# Patient Record
Sex: Female | Born: 1990 | Hispanic: No | Smoking: Former smoker
Health system: Southern US, Community
[De-identification: ages and names within clinical notes are randomized; demographics above are authoritative.]

## PROBLEM LIST (undated history)

## (undated) DIAGNOSIS — J45909 Unspecified asthma, uncomplicated: Secondary | ICD-10-CM

## (undated) HISTORY — PX: SKIN GRAFT: SHX250

## (undated) HISTORY — PX: CHOLECYSTECTOMY: SHX55

---

## 2013-06-26 ENCOUNTER — Encounter (HOSPITAL_COMMUNITY): Payer: Self-pay | Admitting: Emergency Medicine

## 2013-06-26 ENCOUNTER — Emergency Department (HOSPITAL_COMMUNITY)
Admission: EM | Admit: 2013-06-26 | Discharge: 2013-06-26 | Disposition: A | Discharge status: Home / Self Care | Payer: Self-pay | Attending: Emergency Medicine | Admitting: Emergency Medicine

## 2013-06-26 DIAGNOSIS — K529 Noninfective gastroenteritis and colitis, unspecified: Secondary | ICD-10-CM

## 2013-06-26 DIAGNOSIS — F17200 Nicotine dependence, unspecified, uncomplicated: Secondary | ICD-10-CM | POA: Insufficient documentation

## 2013-06-26 DIAGNOSIS — K5289 Other specified noninfective gastroenteritis and colitis: Secondary | ICD-10-CM | POA: Insufficient documentation

## 2013-06-26 LAB — COMPREHENSIVE METABOLIC PANEL
ALK PHOS: 85 U/L (ref 39–117)
ALT: 16 U/L (ref 0–35)
AST: 20 U/L (ref 0–37)
Albumin: 3.9 g/dL (ref 3.5–5.2)
BILIRUBIN TOTAL: 0.2 mg/dL — AB (ref 0.3–1.2)
BUN: 12 mg/dL (ref 6–23)
CHLORIDE: 102 meq/L (ref 96–112)
CO2: 28 mEq/L (ref 19–32)
Calcium: 9.8 mg/dL (ref 8.4–10.5)
Creatinine, Ser: 0.68 mg/dL (ref 0.50–1.10)
GFR calc Af Amer: >90 mL/min (ref >90)
GFR calc non Af Amer: >90 mL/min (ref >90)
Glucose, Bld: 91 mg/dL (ref 70–99)
POTASSIUM: 4.2 meq/L (ref 3.7–5.3)
Sodium: 141 mEq/L (ref 137–147)
Total Protein: 7.9 g/dL (ref 6.0–8.3)

## 2013-06-26 LAB — CBC WITH DIFFERENTIAL/PLATELET
Basophils Absolute: 0 10*3/uL (ref 0.0–0.1)
Basophils Relative: 0 % (ref 0–1)
Eosinophils Absolute: 0.3 10*3/uL (ref 0.0–0.7)
Eosinophils Relative: 3 % (ref 0–5)
HCT: 38.1 % (ref 36.0–46.0)
HEMOGLOBIN: 12.9 g/dL (ref 12.0–15.0)
Lymphocytes Relative: 25 % (ref 12–46)
Lymphs Abs: 2.8 10*3/uL (ref 0.7–4.0)
MCH: 29.5 pg (ref 26.0–34.0)
MCHC: 33.9 g/dL (ref 30.0–36.0)
MCV: 87 fL (ref 78.0–100.0)
MONOS PCT: 8 % (ref 3–12)
Monocytes Absolute: 0.9 10*3/uL (ref 0.1–1.0)
NEUTROS ABS: 7.2 10*3/uL (ref 1.7–7.7)
NEUTROS PCT: 64 % (ref 43–77)
Platelets: 263 10*3/uL (ref 150–400)
RBC: 4.38 MIL/uL (ref 3.87–5.11)
RDW: 13.8 % (ref 11.5–15.5)
WBC: 11.2 10*3/uL — AB (ref 4.0–10.5)

## 2013-06-26 LAB — LIPASE, BLOOD: Lipase: 29 U/L (ref 11–59)

## 2013-06-26 MED ORDER — SODIUM CHLORIDE 0.9 % IV BOLUS (SEPSIS)
1000.0000 mL | Freq: Once | INTRAVENOUS | Status: AC
  Administered 2013-06-26: 1000 mL via INTRAVENOUS

## 2013-06-26 MED ORDER — IBUPROFEN 800 MG PO TABS: ORAL_TABLET | ORAL | Status: DC

## 2013-06-26 MED ORDER — KETOROLAC TROMETHAMINE 30 MG/ML IJ SOLN
30.0000 mg | Freq: Once | INTRAMUSCULAR | Status: AC
  Administered 2013-06-26: 30 mg via INTRAVENOUS
  Filled 2013-06-26: qty 1

## 2013-06-26 MED ORDER — PROMETHAZINE HCL 25 MG PO TABS: 25.0000 mg | ORAL_TABLET | Freq: Four times a day (QID) | ORAL | Status: DC | PRN

## 2013-06-26 MED ORDER — HYDROCODONE-ACETAMINOPHEN 5-325 MG PO TABS
1.0000 | ORAL_TABLET | Freq: Once | ORAL | Status: AC
  Administered 2013-06-26: 1 via ORAL
  Filled 2013-06-26: qty 1

## 2013-06-26 MED ORDER — ONDANSETRON HCL 4 MG/2ML IJ SOLN
4.0000 mg | Freq: Once | INTRAMUSCULAR | Status: AC
  Administered 2013-06-26: 4 mg via INTRAVENOUS
  Filled 2013-06-26: qty 2

## 2013-06-26 NOTE — ED Notes (Signed)
Pt c/o headache, nausea that started yesterday, vomiting and diarrhea, generalized body aches that started today,

## 2013-06-26 NOTE — Discharge Instructions (Signed)
Follow up if not improving.  Drink plenty of fluids °

## 2013-06-26 NOTE — ED Provider Notes (Signed)
CSN: [id number]     Arrival date & time 06/26/13  1608 History   First MD Initiated Contact with Patient 06/26/13 1719     Chief Complaint  Patient presents with  . Emesis     (Consider location/radiation/quality/duration/timing/severity/associated sxs/prior Treatment) Patient is a 23 y.o. female presenting with vomiting. The history is provided by the patient (the pt complains of vomiting diarhea and muscle aches).  Emesis Severity:  Moderate Quality:  Undigested food Feeding tolerance: unknown. Progression:  Unchanged Chronicity:  New Relieved by:  Nothing Associated symptoms: diarrhea   Associated symptoms: no abdominal pain and no headaches     History reviewed. No pertinent past medical history. Past Surgical History  Procedure Laterality Date  . Cholecystectomy     No family history on file. History  Substance Use Topics  . Smoking status: Current Every Day Smoker  . Smokeless tobacco: Not on file  . Alcohol Use: Yes     Comment: occassional    OB History   Grav Para Term Preterm Abortions TAB SAB Ect Mult Living                 Review of Systems  Constitutional: Negative for appetite change and fatigue.  HENT: Negative for congestion, ear discharge and sinus pressure.   Eyes: Negative for discharge.  Respiratory: Negative for cough.   Cardiovascular: Negative for chest pain.  Gastrointestinal: Positive for vomiting and diarrhea. Negative for abdominal pain.  Genitourinary: Negative for frequency and hematuria.  Musculoskeletal: Negative for back pain.  Skin: Negative for rash.  Neurological: Negative for seizures and headaches.  Psychiatric/Behavioral: Negative for hallucinations.      Allergies  Contrast media and Iodine  Home Medications   Current Outpatient Rx  Name  Route  Sig  Dispense  Refill  . acetaminophen (TYLENOL) 500 MG tablet   Oral   Take 500 mg by mouth every 6 (six) hours as needed.         Marland Kitchen albuterol (PROVENTIL HFA;VENTOLIN  HFA) 108 (90 BASE) MCG/ACT inhaler   Inhalation   Inhale 1 puff into the lungs every 6 (six) hours as needed for wheezing or shortness of breath.         . beclomethasone (QVAR) 80 MCG/ACT inhaler   Inhalation   Inhale 1 puff into the lungs 2 (two) times daily as needed (for shortness of breath).         Marland Kitchen ibuprofen (ADVIL,MOTRIN) 800 MG tablet      Take one every 8 hours for aches or pain.   15 tablet   0   . promethazine (PHENERGAN) 25 MG tablet   Oral   Take 1 tablet (25 mg total) by mouth every 6 (six) hours as needed for nausea or vomiting.   15 tablet   0    BP 115/74  Pulse 73  Temp(Src) 98.2 F (36.8 C) (Oral)  Resp 16  Ht 5\' 4"  (1.626 m)  Wt 190 lb (86.183 kg)  BMI 32.60 kg/m2  SpO2 100%  LMP 06/06/2013 Physical Exam  Constitutional: She is oriented to person, place, and time. She appears well-developed.  HENT:  Head: Normocephalic.  Eyes: Conjunctivae and EOM are normal. No scleral icterus.  Neck: Neck supple. No thyromegaly present.  Cardiovascular: Normal rate and regular rhythm.  Exam reveals no gallop and no friction rub.   No murmur heard. Pulmonary/Chest: No stridor. She has no wheezes. She has no rales. She exhibits no tenderness.  Abdominal: She exhibits no distension.  There is no tenderness. There is no rebound.  Musculoskeletal: Normal range of motion. She exhibits no edema.  Lymphadenopathy:    She has no cervical adenopathy.  Neurological: She is oriented to person, place, and time. She exhibits normal muscle tone. Coordination normal.  Skin: No rash noted. No erythema.  Psychiatric: She has a normal mood and affect. Her behavior is normal.    ED Course  Procedures (including critical care time) Labs Review Labs Reviewed  CBC WITH DIFFERENTIAL - Abnormal; Notable for the following:    WBC 11.2 (*)    All other components within normal limits  COMPREHENSIVE METABOLIC PANEL - Abnormal; Notable for the following:    Total Bilirubin 0.2  (*)    All other components within normal limits  LIPASE, BLOOD   Imaging Review No results found.   EKG Interpretation None      MDM   Final diagnoses:  Gastroenteritis       Benny LennertJoseph L Janiah Devinney, MD 06/26/13 1944

## 2014-07-25 ENCOUNTER — Emergency Department (HOSPITAL_COMMUNITY)
Admission: EM | Admit: 2014-07-25 | Discharge: 2014-07-25 | Disposition: A | Discharge status: Home / Self Care | Payer: 59 | Attending: Emergency Medicine | Admitting: Emergency Medicine

## 2014-07-25 ENCOUNTER — Encounter (HOSPITAL_COMMUNITY): Payer: Self-pay | Admitting: *Deleted

## 2014-07-25 ENCOUNTER — Emergency Department (HOSPITAL_COMMUNITY): Payer: 59

## 2014-07-25 DIAGNOSIS — J45909 Unspecified asthma, uncomplicated: Secondary | ICD-10-CM | POA: Insufficient documentation

## 2014-07-25 DIAGNOSIS — Z9049 Acquired absence of other specified parts of digestive tract: Secondary | ICD-10-CM | POA: Insufficient documentation

## 2014-07-25 DIAGNOSIS — Z720 Tobacco use: Secondary | ICD-10-CM | POA: Diagnosis not present

## 2014-07-25 DIAGNOSIS — Z3202 Encounter for pregnancy test, result negative: Secondary | ICD-10-CM | POA: Diagnosis not present

## 2014-07-25 DIAGNOSIS — R52 Pain, unspecified: Secondary | ICD-10-CM

## 2014-07-25 DIAGNOSIS — R109 Unspecified abdominal pain: Secondary | ICD-10-CM | POA: Diagnosis present

## 2014-07-25 DIAGNOSIS — Z79899 Other long term (current) drug therapy: Secondary | ICD-10-CM | POA: Insufficient documentation

## 2014-07-25 HISTORY — DX: Unspecified asthma, uncomplicated: J45.909

## 2014-07-25 LAB — CBC WITH DIFFERENTIAL/PLATELET
BASOS ABS: 0 10*3/uL (ref 0.0–0.1)
BASOS PCT: 0 % (ref 0–1)
Eosinophils Absolute: 0.3 10*3/uL (ref 0.0–0.7)
Eosinophils Relative: 4 % (ref 0–5)
HCT: 37.5 % (ref 36.0–46.0)
Hemoglobin: 12 g/dL (ref 12.0–15.0)
Lymphocytes Relative: 31 % (ref 12–46)
Lymphs Abs: 2.4 10*3/uL (ref 0.7–4.0)
MCH: 28.3 pg (ref 26.0–34.0)
MCHC: 32 g/dL (ref 30.0–36.0)
MCV: 88.4 fL (ref 78.0–100.0)
MONOS PCT: 7 % (ref 3–12)
Monocytes Absolute: 0.5 10*3/uL (ref 0.1–1.0)
NEUTROS ABS: 4.5 10*3/uL (ref 1.7–7.7)
NEUTROS PCT: 58 % (ref 43–77)
Platelets: 246 10*3/uL (ref 150–400)
RBC: 4.24 MIL/uL (ref 3.87–5.11)
RDW: 14.2 % (ref 11.5–15.5)
WBC: 7.7 10*3/uL (ref 4.0–10.5)

## 2014-07-25 LAB — URINALYSIS, ROUTINE W REFLEX MICROSCOPIC
Bilirubin Urine: NEGATIVE
Glucose, UA: NEGATIVE mg/dL
HGB URINE DIPSTICK: NEGATIVE
Ketones, ur: NEGATIVE mg/dL
LEUKOCYTES UA: NEGATIVE
NITRITE: NEGATIVE
PROTEIN: NEGATIVE mg/dL
Specific Gravity, Urine: 1.02 (ref 1.005–1.030)
UROBILINOGEN UA: 0.2 mg/dL (ref 0.0–1.0)
pH: 5.5 (ref 5.0–8.0)

## 2014-07-25 LAB — COMPREHENSIVE METABOLIC PANEL
ALT: 16 U/L (ref 0–35)
ANION GAP: 4 — AB (ref 5–15)
AST: 19 U/L (ref 0–37)
Albumin: 3.7 g/dL (ref 3.5–5.2)
Alkaline Phosphatase: 75 U/L (ref 39–117)
BUN: 16 mg/dL (ref 6–23)
CO2: 26 mmol/L (ref 19–32)
Calcium: 8.8 mg/dL (ref 8.4–10.5)
Chloride: 110 mmol/L (ref 96–112)
Creatinine, Ser: 0.75 mg/dL (ref 0.50–1.10)
GFR calc Af Amer: >90 mL/min (ref >90)
Glucose, Bld: 91 mg/dL (ref 70–99)
Potassium: 3.8 mmol/L (ref 3.5–5.1)
SODIUM: 140 mmol/L (ref 135–145)
Total Bilirubin: 0.1 mg/dL — ABNORMAL LOW (ref 0.3–1.2)
Total Protein: 6.8 g/dL (ref 6.0–8.3)

## 2014-07-25 LAB — PREGNANCY, URINE: Preg Test, Ur: NEGATIVE

## 2014-07-25 MED ORDER — HYDROMORPHONE HCL 1 MG/ML IJ SOLN
1.0000 mg | Freq: Once | INTRAMUSCULAR | Status: AC
  Administered 2014-07-25: 1 mg via INTRAVENOUS
  Filled 2014-07-25: qty 1

## 2014-07-25 MED ORDER — HYDROCODONE-ACETAMINOPHEN 5-325 MG PO TABS: 1.0000 | ORAL_TABLET | Freq: Four times a day (QID) | ORAL | Status: DC | PRN

## 2014-07-25 MED ORDER — HYDROMORPHONE HCL 1 MG/ML IJ SOLN
0.5000 mg | Freq: Once | INTRAMUSCULAR | Status: AC
  Administered 2014-07-25: 0.5 mg via INTRAVENOUS
  Filled 2014-07-25: qty 1

## 2014-07-25 MED ORDER — ONDANSETRON HCL 4 MG/2ML IJ SOLN
4.0000 mg | Freq: Once | INTRAMUSCULAR | Status: DC
  Filled 2014-07-25: qty 2

## 2014-07-25 MED ORDER — ONDANSETRON HCL 4 MG/2ML IJ SOLN
4.0000 mg | Freq: Once | INTRAMUSCULAR | Status: AC
  Administered 2014-07-25: 4 mg via INTRAVENOUS

## 2014-07-25 MED ORDER — ONDANSETRON HCL 4 MG/2ML IJ SOLN
4.0000 mg | Freq: Once | INTRAMUSCULAR | Status: AC
  Administered 2014-07-25: 4 mg via INTRAVENOUS
  Filled 2014-07-25: qty 2

## 2014-07-25 NOTE — Discharge Instructions (Signed)
Follow up with [facility] in 1 week °

## 2014-07-25 NOTE — ED Provider Notes (Signed)
CSN: [id number]     Arrival date & time 07/25/14  1924 History   First MD Initiated Contact with Patient 07/25/14 1942     Chief Complaint  Patient presents with  . Flank Pain     (Consider location/radiation/quality/duration/timing/severity/associated sxs/prior Treatment) Patient is a 24 y.o. female presenting with flank pain. The history is provided by the patient (pt complains of right flank pain).  Flank Pain This is a new problem. The current episode started 2 days ago. The problem occurs constantly. The problem has not changed since onset.Associated symptoms include abdominal pain. Pertinent negatives include no chest pain and no headaches. Nothing aggravates the symptoms. Nothing relieves the symptoms.    Past Medical History  Diagnosis Date  . Asthma    Past Surgical History  Procedure Laterality Date  . Cholecystectomy     History reviewed. No pertinent family history. History  Substance Use Topics  . Smoking status: Current Every Day Smoker  . Smokeless tobacco: Not on file  . Alcohol Use: Yes     Comment: occassional    OB History    No data available     Review of Systems  Constitutional: Negative for appetite change and fatigue.  HENT: Negative for congestion, ear discharge and sinus pressure.   Eyes: Negative for discharge.  Respiratory: Negative for cough.   Cardiovascular: Negative for chest pain.  Gastrointestinal: Positive for abdominal pain. Negative for diarrhea.  Genitourinary: Positive for flank pain. Negative for frequency and hematuria.  Musculoskeletal: Negative for back pain.  Skin: Negative for rash.  Neurological: Negative for seizures and headaches.  Psychiatric/Behavioral: Negative for hallucinations.      Allergies  Contrast media and Iodine  Home Medications   Prior to Admission medications   Medication Sig Start Date End Date Taking? Authorizing Provider  acetaminophen (TYLENOL) 500 MG tablet Take 500 mg by mouth every 6 (six)  hours as needed for mild pain or moderate pain.    Yes Historical Provider, MD  phenazopyridine (PYRIDIUM) 100 MG tablet Take 100 mg by mouth 3 (three) times daily as needed for pain.   Yes Historical Provider, MD  UNKNOWN TO PATIENT Take 1 capsule by mouth 3 (three) times daily. ANTIBIOTIC FOR UTI PRESCRIBED 07/24/2014   Yes Historical Provider, MD  albuterol (PROVENTIL HFA;VENTOLIN HFA) 108 (90 BASE) MCG/ACT inhaler Inhale 1 puff into the lungs every 6 (six) hours as needed for wheezing or shortness of breath.    Historical Provider, MD  beclomethasone (QVAR) 80 MCG/ACT inhaler Inhale 1 puff into the lungs 2 (two) times daily as needed (for shortness of breath).    Historical Provider, MD   BP 110/52 mmHg  Pulse 77  Temp(Src) 98.2 F (36.8 C) (Oral)  Resp 24  Ht  (1.626 m)  Wt 234 lb (106.142 kg)  BMI 40.15 kg/m2  SpO2 100%  LMP 07/21/2014 Physical Exam  Constitutional: She is oriented to person, place, and time. She appears well-developed.  HENT:  Head: Normocephalic.  Eyes: Conjunctivae and EOM are normal. No scleral icterus.  Neck: Neck supple. No thyromegaly present.  Cardiovascular: Normal rate and regular rhythm.  Exam reveals no gallop and no friction rub.   No murmur heard. Pulmonary/Chest: No stridor. She has no wheezes. She has no rales. She exhibits no tenderness.  Abdominal: She exhibits no distension. There is no tenderness. There is no rebound.  Genitourinary:  Tender right flank  Musculoskeletal: Normal range of motion. She exhibits no edema.  Lymphadenopathy:  She has no cervical adenopathy.  Neurological: She is oriented to person, place, and time. She exhibits normal muscle tone. Coordination normal.  Skin: No rash noted. No erythema.  Psychiatric: She has a normal mood and affect. Her behavior is normal.    ED Course  Procedures (including critical care time) Labs Review Labs Reviewed  COMPREHENSIVE METABOLIC PANEL - Abnormal; Notable for the  following:    Total Bilirubin 0.1 (*)    Anion gap 4 (*)    All other components within normal limits  CBC WITH DIFFERENTIAL/PLATELET  URINALYSIS, ROUTINE W REFLEX MICROSCOPIC  PREGNANCY, URINE    Imaging Review No results found.   EKG Interpretation None      MDM   Final diagnoses:  Pain    Flank pain    Bethann BerkshireJoseph Elery Cadenhead, MD 07/25/14 2147

## 2014-07-25 NOTE — ED Notes (Signed)
Rt flank pain, hematuria, seen at Urgent care and given med for  UTI.  N/v  No fever

## 2014-07-26 NOTE — ED Notes (Signed)
Pt. Reports being seen at urgent care yesterday. Pt. Reports being treated for UTI. Pt. Reports that they ruled out kidney stone yesterday. Pt. Reports pain with urination. Pt. Reports working in woods several days ago and reports that friend that was with her is having similar symptoms. Pt. Denies being bit by ticks. Pt.denies rash. Pt. Reports vomiting this morning.

## 2015-06-21 ENCOUNTER — Emergency Department (HOSPITAL_COMMUNITY)
Admission: EM | Admit: 2015-06-21 | Discharge: 2015-06-21 | Disposition: A | Discharge status: Home / Self Care | Payer: 59 | Attending: Emergency Medicine | Admitting: Emergency Medicine

## 2015-06-21 ENCOUNTER — Encounter (HOSPITAL_COMMUNITY): Payer: Self-pay

## 2015-06-21 DIAGNOSIS — F17200 Nicotine dependence, unspecified, uncomplicated: Secondary | ICD-10-CM | POA: Insufficient documentation

## 2015-06-21 DIAGNOSIS — Z79899 Other long term (current) drug therapy: Secondary | ICD-10-CM | POA: Insufficient documentation

## 2015-06-21 DIAGNOSIS — R112 Nausea with vomiting, unspecified: Secondary | ICD-10-CM | POA: Insufficient documentation

## 2015-06-21 DIAGNOSIS — R509 Fever, unspecified: Secondary | ICD-10-CM

## 2015-06-21 DIAGNOSIS — E860 Dehydration: Secondary | ICD-10-CM | POA: Insufficient documentation

## 2015-06-21 DIAGNOSIS — R197 Diarrhea, unspecified: Secondary | ICD-10-CM | POA: Insufficient documentation

## 2015-06-21 DIAGNOSIS — J45909 Unspecified asthma, uncomplicated: Secondary | ICD-10-CM | POA: Insufficient documentation

## 2015-06-21 DIAGNOSIS — M791 Myalgia, unspecified site: Secondary | ICD-10-CM

## 2015-06-21 LAB — COMPREHENSIVE METABOLIC PANEL
ALT: 16 U/L (ref 14–54)
AST: 17 U/L (ref 15–41)
Albumin: 4.1 g/dL (ref 3.5–5.0)
Alkaline Phosphatase: 82 U/L (ref 38–126)
Anion gap: 6 (ref 5–15)
BUN: 10 mg/dL (ref 6–20)
CALCIUM: 9.1 mg/dL (ref 8.9–10.3)
CO2: 28 mmol/L (ref 22–32)
Chloride: 104 mmol/L (ref 101–111)
Creatinine, Ser: 0.75 mg/dL (ref 0.44–1.00)
GFR calc Af Amer: >60 mL/min (ref >60)
Glucose, Bld: 90 mg/dL (ref 65–99)
Potassium: 4 mmol/L (ref 3.5–5.1)
Sodium: 138 mmol/L (ref 135–145)
Total Bilirubin: 0.4 mg/dL (ref 0.3–1.2)
Total Protein: 7.6 g/dL (ref 6.5–8.1)

## 2015-06-21 LAB — CBC
HEMATOCRIT: 39.7 % (ref 36.0–46.0)
HEMOGLOBIN: 12.8 g/dL (ref 12.0–15.0)
MCH: 28.6 pg (ref 26.0–34.0)
MCHC: 32.2 g/dL (ref 30.0–36.0)
MCV: 88.8 fL (ref 78.0–100.0)
Platelets: 259 10*3/uL (ref 150–400)
RBC: 4.47 MIL/uL (ref 3.87–5.11)
RDW: 14.5 % (ref 11.5–15.5)
WBC: 10 10*3/uL (ref 4.0–10.5)

## 2015-06-21 LAB — LIPASE, BLOOD: Lipase: 19 U/L (ref 11–51)

## 2015-06-21 MED ORDER — ONDANSETRON HCL 4 MG/2ML IJ SOLN
4.0000 mg | Freq: Once | INTRAMUSCULAR | Status: AC
  Administered 2015-06-21: 4 mg via INTRAVENOUS
  Filled 2015-06-21: qty 2

## 2015-06-21 MED ORDER — KETOROLAC TROMETHAMINE 30 MG/ML IJ SOLN
30.0000 mg | Freq: Once | INTRAMUSCULAR | Status: AC
  Administered 2015-06-21: 30 mg via INTRAVENOUS
  Filled 2015-06-21: qty 1

## 2015-06-21 MED ORDER — SODIUM CHLORIDE 0.9 % IV BOLUS (SEPSIS)
1000.0000 mL | Freq: Once | INTRAVENOUS | Status: AC
  Administered 2015-06-21: 1000 mL via INTRAVENOUS

## 2015-06-21 MED ORDER — ONDANSETRON 4 MG PO TBDP: 4.0000 mg | ORAL_TABLET | Freq: Three times a day (TID) | ORAL | Status: AC | PRN

## 2015-06-21 NOTE — ED Notes (Signed)
Pt c/o fever, body aches, vomiting, and diarrhea since Monday.

## 2015-06-21 NOTE — Discharge Instructions (Signed)

## 2015-06-21 NOTE — ED Provider Notes (Signed)
CSN: [id number]     Arrival date & time 06/21/15  1132 History   First MD Initiated Contact with Patient 06/21/15 1512     Chief Complaint  Patient presents with  . Generalized Body Aches  . Emesis  . Diarrhea     (Consider location/radiation/quality/duration/timing/severity/associated sxs/prior Treatment) HPI Comments: The patient is a 25 year old female, she has no other significant medical problems, the patient states that she has had approximately 3 days of fever, body aches, headache, nasal congestion as well as nausea and vomiting times several episodes. She has had loose stools yesterday but no blood or watery stools. She denies abdominal pain or back pain. She has had no medications. She believes that she has the flu and states that multiple people around her of had the flu as well. She did not get a flu shot. Her symptoms are persistent, they are not getting better or worse.  Patient is a 25 y.o. female presenting with vomiting and diarrhea. The history is provided by the patient.  Emesis Associated symptoms: diarrhea   Diarrhea Associated symptoms: vomiting     Past Medical History  Diagnosis Date  . Asthma    Past Surgical History  Procedure Laterality Date  . Cholecystectomy     No family history on file. Social History  Substance Use Topics  . Smoking status: Current Every Day Smoker  . Smokeless tobacco: None  . Alcohol Use: No   OB History    No data available     Review of Systems  Gastrointestinal: Positive for vomiting and diarrhea.  All other systems reviewed and are negative.     Allergies  Contrast media and Iodine  Home Medications   Prior to Admission medications   Medication Sig Start Date End Date Taking? Authorizing Provider  acetaminophen (TYLENOL) 500 MG tablet Take 500 mg by mouth every 6 (six) hours as needed for mild pain or moderate pain.    Yes Historical Provider, MD  citalopram (CELEXA) 20 MG tablet Take 20 mg by mouth daily.    Yes Historical Provider, MD  guaiFENesin (MUCINEX) 600 MG 12 hr tablet Take 600 mg by mouth 2 (two) times daily as needed for cough or to loosen phlegm.   Yes Historical Provider, MD  Pseudoeph-Doxylamine-DM-APAP (DAYQUIL/NYQUIL COLD/FLU RELIEF PO) Take 30 mLs by mouth daily as needed (cold).   Yes Historical Provider, MD   BP 106/71 mmHg  Pulse 60  Temp(Src) 98 F (36.7 C) (Temporal)  Resp 18  Ht  (1.626 m)  Wt 220 lb (99.791 kg)  BMI 37.74 kg/m2  SpO2 100%  LMP 06/07/2015 Physical Exam  Constitutional: She appears well-developed and well-nourished. No distress.  HENT:  Head: Normocephalic and atraumatic.  Mouth/Throat: Oropharynx is clear and moist. No oropharyngeal exudate.  Eyes: Conjunctivae and EOM are normal. Pupils are equal, round, and reactive to light. Right eye exhibits no discharge. Left eye exhibits no discharge. No scleral icterus.  Neck: Normal range of motion. Neck supple. No JVD present. No thyromegaly present.  Cardiovascular: Normal rate, regular rhythm, normal heart sounds and intact distal pulses.  Exam reveals no gallop and no friction rub.   No murmur heard. Pulmonary/Chest: Effort normal and breath sounds normal. No respiratory distress. She has no wheezes. She has no rales.  Abdominal: Soft. Bowel sounds are normal. She exhibits no distension and no mass. There is no tenderness.  Musculoskeletal: Normal range of motion. She exhibits no edema or tenderness.  Lymphadenopathy:    She has  no cervical adenopathy.  Neurological: She is alert. Coordination normal.  Skin: Skin is warm and dry. No rash noted. No erythema.  Psychiatric: She has a normal mood and affect. Her behavior is normal.  Nursing note and vitals reviewed.   ED Course  Procedures (including critical care time) Labs Review Labs Reviewed  LIPASE, BLOOD  COMPREHENSIVE METABOLIC PANEL  CBC  URINALYSIS, ROUTINE W REFLEX MICROSCOPIC (NOT AT [facility])  PREGNANCY, URINE    Imaging Review No  results found. I have personally reviewed and evaluated these images and lab results as part of my medical decision-making.    MDM   Final diagnoses:  None    The patient has no abdominal tenderness, she is having ongoing nausea and body aches, her labs are totally unremarkable, vital signs are also unremarkable, I will give her fluid and nausea medication as well as Toradol for her headache, this is symptomatic control of what appears to be influenza-like illness. She is otherwise stable for discharge. She is agreeable to the plan and has been warned that her symptoms may last for another week. She will be given prescriptions to handle her symptoms at home.  The patient has improved significantly , she refuses to give a urine sample, she wants to be discharged home, she will be given medicine for nausea and encouraged to use ibuprofen for body aches and fevers  Meds given in ED:  Medications  sodium chloride 0.9 % bolus 1,000 mL (1,000 mLs Intravenous New Bag/Given 06/21/15 1610)  ondansetron (ZOFRAN) injection 4 mg (4 mg Intravenous Given 06/21/15 1611)  ketorolac (TORADOL) 30 MG/ML injection 30 mg (30 mg Intravenous Given 06/21/15 1612)    New Prescriptions   No medications on file      Eber HongBrian Zackry Deines, MD 06/21/15 1726

## 2017-02-17 ENCOUNTER — Emergency Department (HOSPITAL_COMMUNITY)
Admission: EM | Admit: 2017-02-17 | Discharge: 2017-02-17 | Disposition: A | Discharge status: Home / Self Care | Payer: Self-pay | Attending: Emergency Medicine | Admitting: Emergency Medicine

## 2017-02-17 ENCOUNTER — Encounter (HOSPITAL_COMMUNITY): Payer: Self-pay | Admitting: Cardiology

## 2017-02-17 ENCOUNTER — Emergency Department (HOSPITAL_COMMUNITY): Payer: Self-pay

## 2017-02-17 DIAGNOSIS — J40 Bronchitis, not specified as acute or chronic: Secondary | ICD-10-CM | POA: Insufficient documentation

## 2017-02-17 DIAGNOSIS — Z79899 Other long term (current) drug therapy: Secondary | ICD-10-CM | POA: Insufficient documentation

## 2017-02-17 DIAGNOSIS — Z87891 Personal history of nicotine dependence: Secondary | ICD-10-CM | POA: Insufficient documentation

## 2017-02-17 LAB — INFLUENZA PANEL BY PCR (TYPE A & B)
Influenza A By PCR: NEGATIVE
Influenza B By PCR: NEGATIVE

## 2017-02-17 MED ORDER — AZITHROMYCIN 250 MG PO TABS: 250.0000 mg | ORAL_TABLET | Freq: Every day | ORAL | 0 refills | Status: AC

## 2017-02-17 MED ORDER — BENZONATATE 100 MG PO CAPS: 100.0000 mg | ORAL_CAPSULE | Freq: Three times a day (TID) | ORAL | 0 refills | Status: AC

## 2017-02-17 MED ORDER — ALBUTEROL SULFATE HFA 108 (90 BASE) MCG/ACT IN AERS
2.0000 | INHALATION_SPRAY | RESPIRATORY_TRACT | Status: DC | PRN
  Administered 2017-02-17: 2 via RESPIRATORY_TRACT
  Filled 2017-02-17: qty 6.7

## 2017-02-17 MED ORDER — PREDNISONE 10 MG PO TABS: 20.0000 mg | ORAL_TABLET | Freq: Two times a day (BID) | ORAL | 0 refills | Status: AC

## 2017-02-17 NOTE — ED Triage Notes (Signed)
Fever ,  Nausea and vomiting and generalized body aches.  Coughing.  Symptoms started Thursday.

## 2017-02-17 NOTE — ED Provider Notes (Signed)
[facility] Provider Note   CSN: [id number] Arrival date & time: 02/17/17  1505     History   Chief Complaint Chief Complaint  Patient presents with  . Influenza    HPI Destiny Leonard is a 26 y.o. female with hx of asthma who presents to the ED with flu like symptoms. Patient reports chills, fever, fatigue, aching all over for the past few days. Patient states her work made her come in to   The history is provided by the patient. No language interpreter was used.  Influenza  Presenting symptoms: cough, fatigue, fever, headache, myalgias and sore throat   Presenting symptoms: no nausea and no vomiting   Severity:  Moderate Onset quality:  Gradual Duration:  4 days Progression:  Worsening Chronicity:  New Relieved by:  Nothing Worsened by:  Nothing Ineffective treatments:  Decongestant, hot fluids, OTC medications and rest Associated symptoms: chills, decreased appetite, decreased physical activity, ear pain and nasal congestion   Associated symptoms: no mental status change, no neck stiffness and no syncope     Past Medical History:  Diagnosis Date  . Asthma     There are no active problems to display for this patient.   Past Surgical History:  Procedure Laterality Date  . CHOLECYSTECTOMY    . SKIN GRAFT      OB History    No data available       Home Medications    Prior to Admission medications   Medication Sig Start Date End Date Taking? Authorizing Provider  acetaminophen (TYLENOL) 500 MG tablet Take 500 mg by mouth every 6 (six) hours as needed for mild pain or moderate pain.     [provider]  azithromycin (ZITHROMAX) 250 MG tablet Take 1 tablet (250 mg total) daily by mouth. Take first 2 tablets together, then 1 every day until finished. 02/17/17   Janne NapoleonNeese, Jaret Coppedge M, NP  benzonatate (TESSALON) 100 MG capsule Take 1 capsule (100 mg total) every 8 (eight) hours by mouth. 02/17/17   Janne NapoleonNeese, Alayshia Marini M, NP  citalopram  (CELEXA) 20 MG tablet Take 20 mg by mouth daily.    [provider]  guaiFENesin (MUCINEX) 600 MG 12 hr tablet Take 600 mg by mouth 2 (two) times daily as needed for cough or to loosen phlegm.    [provider]  ondansetron (ZOFRAN ODT) 4 MG disintegrating tablet Take 1 tablet (4 mg total) by mouth every 8 (eight) hours as needed for nausea. 06/21/15   Eber HongMiller, Brian, MD  predniSONE (DELTASONE) 10 MG tablet Take 2 tablets (20 mg total) 2 (two) times daily with a meal by mouth. 02/17/17   Taegan Standage, Theodora BlowHope M, NP  Pseudoeph-Doxylamine-DM-APAP (DAYQUIL/NYQUIL COLD/FLU RELIEF PO) Take 30 mLs by mouth daily as needed (cold).    [provider]    Family History History reviewed. No pertinent family history.  Social History Social History   Tobacco Use  . Smoking status: Former Smoker  Substance Use Topics  . Alcohol use: No  . Drug use: No     Allergies   Contrast media [iodinated diagnostic agents] and Iodine   Review of Systems Review of Systems  Constitutional: Positive for chills, decreased appetite, fatigue and fever.  HENT: Positive for congestion, ear pain and sore throat.   Eyes: Positive for discharge, redness and itching.  Respiratory: Positive for cough and chest tightness.   Cardiovascular: Negative for chest pain.  Gastrointestinal: Negative for abdominal pain, nausea and vomiting.  Genitourinary: Negative  for dysuria, frequency and urgency.  Musculoskeletal: Positive for myalgias. Negative for neck stiffness.  Skin: Negative for rash.  Neurological: Positive for headaches. Negative for syncope.  Psychiatric/Behavioral: Negative for confusion.     Physical Exam Updated Vital Signs BP (!) 149/78 (BP Location: Right Arm)   Pulse 72   Temp 98.6 F (37 C) (Oral)   Resp 20   Ht 5\' 4"  (1.626 m)   Wt 99.8 kg (220 lb)   LMP 08/17/2016   SpO2 100%   BMI 37.76 kg/m   Physical Exam  Constitutional: She appears well-developed and well-nourished.  No distress.  HENT:  Head: Normocephalic and atraumatic.  Right Ear: Tympanic membrane normal.  Left Ear: Tympanic membrane normal.  Nose: Mucosal edema and rhinorrhea present.  Mouth/Throat: Uvula is midline and mucous membranes are normal. Posterior oropharyngeal erythema present. No posterior oropharyngeal edema.  Eyes: Conjunctivae and EOM are normal. Pupils are equal, round, and reactive to light.  Neck: Normal range of motion. Neck supple.  Cardiovascular: Normal rate and regular rhythm.  Pulmonary/Chest: Effort normal. Wheezes: occasional. She exhibits no tenderness.  Abdominal: Soft. There is no tenderness.  Musculoskeletal: Normal range of motion.  Lymphadenopathy:    She has no cervical adenopathy.  Neurological: She is alert.  Skin: Skin is warm and dry.  Psychiatric: She has a normal mood and affect. Her behavior is normal.  Nursing note and vitals reviewed.    ED Treatments / Results  Labs (all labs ordered are listed, but only abnormal results are displayed) Labs Reviewed  INFLUENZA PANEL BY PCR (TYPE A & B)     Radiology Dg Chest 2 View  Result Date: 02/17/2017 CLINICAL DATA:  26 year old female with cough and fever. EXAM: CHEST  2 VIEW COMPARISON:  None. FINDINGS: The heart size and mediastinal contours are within normal limits. Both lungs are clear. The visualized skeletal structures are unremarkable. IMPRESSION: No active cardiopulmonary disease. Electronically Signed   By: Elgie CollardArash  Radparvar M.D.   On: 02/17/2017 18:28    Procedures Procedures (including critical care time)  Medications Ordered in ED Medications  albuterol (PROVENTIL HFA;VENTOLIN HFA) 108 (90 Base) MCG/ACT inhaler 2 puff (not administered)     Initial Impression / Assessment and Plan / ED Course  I have reviewed the triage vital signs and the nursing notes. Pt CXR negative for acute infiltrate. Patients symptoms are consistent with bronchitis, Discussed use of Albuterol in haler for  wheezing. Pt will be discharged with Rx for Zithromax, Tessalon, prednisone. Patient verbalizes understanding and is agreeable with plan. Pt is hemodynamically stable & in NAD prior to dc.  Final Clinical Impressions(s) / ED Diagnoses   Final diagnoses:  Bronchitis    ED Discharge Orders        Ordered    azithromycin (ZITHROMAX) 250 MG tablet  Daily     02/17/17 1916    benzonatate (TESSALON) 100 MG capsule  Every 8 hours     02/17/17 1916    predniSONE (DELTASONE) 10 MG tablet  2 times daily with meals     02/17/17 1916       Kerrie Buffaloeese, Gerritt Galentine RichlandM, NP 02/17/17 1924    Bethann BerkshireZammit, Joseph, MD 02/18/17 1502

## 2017-02-17 NOTE — Discharge Instructions (Signed)
Use the medication as directed. Follow up with your doctor. Return here for worsening symptoms.

## 2017-07-30 ENCOUNTER — Encounter (HOSPITAL_COMMUNITY): Payer: Self-pay | Admitting: Emergency Medicine

## 2017-07-30 ENCOUNTER — Other Ambulatory Visit: Payer: Self-pay

## 2017-07-30 ENCOUNTER — Emergency Department (HOSPITAL_COMMUNITY)
Admission: EM | Admit: 2017-07-30 | Discharge: 2017-07-30 | Disposition: A | Discharge status: Home / Self Care | Payer: 59 | Attending: Emergency Medicine | Admitting: Emergency Medicine

## 2017-07-30 DIAGNOSIS — J45909 Unspecified asthma, uncomplicated: Secondary | ICD-10-CM | POA: Insufficient documentation

## 2017-07-30 DIAGNOSIS — Z87891 Personal history of nicotine dependence: Secondary | ICD-10-CM | POA: Insufficient documentation

## 2017-07-30 DIAGNOSIS — Z79899 Other long term (current) drug therapy: Secondary | ICD-10-CM | POA: Insufficient documentation

## 2017-07-30 DIAGNOSIS — R040 Epistaxis: Secondary | ICD-10-CM | POA: Insufficient documentation

## 2017-07-30 DIAGNOSIS — Z9049 Acquired absence of other specified parts of digestive tract: Secondary | ICD-10-CM | POA: Insufficient documentation

## 2017-07-30 MED ORDER — SILVER NITRATE-POT NITRATE 75-25 % EX MISC
1.0000 | Freq: Once | CUTANEOUS | Status: AC
Start: 2017-07-30 — End: 2017-07-30
  Administered 2017-07-30: 1 via TOPICAL
  Filled 2017-07-30: qty 1

## 2017-07-30 MED ORDER — OXYMETAZOLINE HCL 0.05 % NA SOLN
1.0000 | Freq: Once | NASAL | Status: AC
  Administered 2017-07-30: 1 via NASAL
  Filled 2017-07-30: qty 15

## 2017-07-30 NOTE — Discharge Instructions (Addendum)
Try to avoid blowing your nose or sneezing for 2-3 days.  Return here if needed

## 2017-07-30 NOTE — ED Triage Notes (Signed)
Pt c/o nosebleed x one hour. Pt's right nare was bleeding, bleeding controlled at this time.

## 2017-08-01 NOTE — ED Provider Notes (Signed)
[facility] EMERGENCY DEPARTMENT Provider Note   CSN: [id number] Arrival date & time: 07/30/17  2029     History   Chief Complaint Chief Complaint  Patient presents with  . Epistaxis    HPI Destiny Leonard is a 27 y.o. adult.  HPI   Destiny Leonard is a 27 y.o. adult who presents to the Emergency Department complaining of intermittent nosebleed.  symptom began at work shortly before ER arrival.  She states her nose has been bleeding intermittently for one hour.  This is a recurrent problem.  She was recently seen by her PCP for this and given a nose spray which she states is not helping.  Bleeding has been from the right nostril only.  She denies bleeding disorders, difficulty swallowing or breathing, headache, weakness, vomiting, or dizziness.  No trauma.     Past Medical History:  Diagnosis Date  . Asthma     There are no active problems to display for this patient.   Past Surgical History:  Procedure Laterality Date  . CHOLECYSTECTOMY    . SKIN GRAFT       OB History   None      Home Medications    Prior to Admission medications   Medication Sig Start Date End Date Taking? Authorizing Provider  acetaminophen (TYLENOL) 500 MG tablet Take 500 mg by mouth every 6 (six) hours as needed for mild pain or moderate pain.     [provider]  azithromycin (ZITHROMAX) 250 MG tablet Take 1 tablet (250 mg total) daily by mouth. Take first 2 tablets together, then 1 every day until finished. 02/17/17   Janne Napoleon, NP  benzonatate (TESSALON) 100 MG capsule Take 1 capsule (100 mg total) every 8 (eight) hours by mouth. 02/17/17   Janne Napoleon, NP  citalopram (CELEXA) 20 MG tablet Take 20 mg by mouth daily.    [provider]  guaiFENesin (MUCINEX) 600 MG 12 hr tablet Take 600 mg by mouth 2 (two) times daily as needed for cough or to loosen phlegm.    [provider]  ondansetron (ZOFRAN ODT) 4 MG disintegrating tablet Take 1 tablet (4 mg  total) by mouth every 8 (eight) hours as needed for nausea. 06/21/15   Eber Hong, MD  predniSONE (DELTASONE) 10 MG tablet Take 2 tablets (20 mg total) 2 (two) times daily with a meal by mouth. 02/17/17   Neese, Theodora Blow, NP  Pseudoeph-Doxylamine-DM-APAP (DAYQUIL/NYQUIL COLD/FLU RELIEF PO) Take 30 mLs by mouth daily as needed (cold).    [provider]    Family History History reviewed. No pertinent family history.  Social History Social History   Tobacco Use  . Smoking status: Former Games developer  . Smokeless tobacco: Never Used  Substance Use Topics  . Alcohol use: No  . Drug use: No     Allergies   Contrast media [iodinated diagnostic agents] and Iodine   Review of Systems Review of Systems  Constitutional: Negative for activity change, appetite change, fatigue and fever.  HENT: Positive for nosebleeds. Negative for congestion, sore throat and trouble swallowing.   Respiratory: Negative for cough and shortness of breath.   Cardiovascular: Negative for chest pain.  Gastrointestinal: Negative for abdominal pain, nausea and vomiting.  Musculoskeletal: Negative for neck pain and neck stiffness.  Neurological: Negative for dizziness, facial asymmetry, weakness, light-headedness, numbness and headaches.  Hematological: Does not bruise/bleed easily.     Physical Exam Updated Vital Signs BP 109/69 (BP Location: Left Arm)  Pulse 92   Temp 98.5 F (36.9 C) (Temporal)   Resp 15   Ht 5\' 4"  (1.626 m)   Wt 116.9 kg (257 lb 12.8 oz)   SpO2 100%   BMI 44.25 kg/m   Physical Exam  Constitutional: She appears well-developed and well-nourished. No distress.  HENT:  Head: Atraumatic.  Right Ear: Tympanic membrane and ear canal normal.  Left Ear: Tympanic membrane and ear canal normal.  Nose: Mucosal edema present. No nasal septal hematoma. Epistaxis is observed.  Mild bleeding of the right nare.  Friable area to the anterior septum.  Oropharynx clear and non-edematous.  No  clots.  Nursing note and vitals reviewed.    ED Treatments / Results  Labs (all labs ordered are listed, but only abnormal results are displayed) Labs Reviewed - No data to display  EKG None  Radiology No results found.  Procedures Procedures (including critical care time)  Medications Ordered in ED Medications  oxymetazoline (AFRIN) 0.05 % nasal spray 1 spray (1 spray Each Nare Given 07/30/17 2131)  silver nitrate applicators applicator 1 Stick (1 Stick Topical Given by Other 07/30/17 2148)     Initial Impression / Assessment and Plan / ED Course  I have reviewed the triage vital signs and the nursing notes.  Pertinent labs & imaging results that were available during my care of the patient were reviewed by me and considered in my medical decision making (see chart for details).     Epistaxis resolved after applying Afrin.  Friable area to anterior septum visualized and cauterized by me using a silver nitrate stick. Airway patent.  No associated sx's to indicate need for labs.    Pt observed for awhile and no further bleeding.  Appears safe for d/c home.  Agrees to avoid blowing or picking her nose for 2-3 days.  Return precautions discussed.    Final Clinical Impressions(s) / ED Diagnoses   Final diagnoses:  Anterior epistaxis    ED Discharge Orders    None       Pauline Ausriplett, Prestyn Mahn, PA-C 08/01/17 2302    Maia PlanLong, Joshua G, MD 08/02/17 0110

## 2017-09-16 ENCOUNTER — Emergency Department (HOSPITAL_COMMUNITY)
Admission: EM | Admit: 2017-09-16 | Discharge: 2017-09-17 | Disposition: A | Discharge status: Home / Self Care | Payer: Self-pay | Attending: Emergency Medicine | Admitting: Emergency Medicine

## 2017-09-16 DIAGNOSIS — R059 Cough, unspecified: Secondary | ICD-10-CM

## 2017-09-16 DIAGNOSIS — Z79899 Other long term (current) drug therapy: Secondary | ICD-10-CM | POA: Insufficient documentation

## 2017-09-16 DIAGNOSIS — Z87891 Personal history of nicotine dependence: Secondary | ICD-10-CM | POA: Insufficient documentation

## 2017-09-16 DIAGNOSIS — J45909 Unspecified asthma, uncomplicated: Secondary | ICD-10-CM | POA: Insufficient documentation

## 2017-09-16 DIAGNOSIS — R05 Cough: Secondary | ICD-10-CM

## 2017-09-16 DIAGNOSIS — R1115 Cyclical vomiting syndrome unrelated to migraine: Secondary | ICD-10-CM

## 2017-09-16 DIAGNOSIS — G43A0 Cyclical vomiting, not intractable: Secondary | ICD-10-CM | POA: Insufficient documentation

## 2017-09-17 ENCOUNTER — Other Ambulatory Visit: Payer: Self-pay

## 2017-09-17 ENCOUNTER — Encounter (HOSPITAL_COMMUNITY): Payer: Self-pay

## 2017-09-17 LAB — I-STAT CHEM 8, ED
BUN: 14 mg/dL (ref 6–20)
Calcium, Ion: 1.21 mmol/L (ref 1.15–1.40)
Chloride: 101 mmol/L (ref 101–111)
Creatinine, Ser: 0.7 mg/dL (ref 0.44–1.00)
Glucose, Bld: 115 mg/dL — ABNORMAL HIGH (ref 65–99)
HCT: 34 % — ABNORMAL LOW (ref 36.0–46.0)
HEMOGLOBIN: 11.6 g/dL — AB (ref 12.0–15.0)
POTASSIUM: 3.8 mmol/L (ref 3.5–5.1)
SODIUM: 139 mmol/L (ref 135–145)
TCO2: 25 mmol/L (ref 22–32)

## 2017-09-17 MED ORDER — SODIUM CHLORIDE 0.9 % IV BOLUS
1000.0000 mL | Freq: Once | INTRAVENOUS | Status: AC
  Administered 2017-09-17: 1000 mL via INTRAVENOUS

## 2017-09-17 MED ORDER — SODIUM CHLORIDE 0.9 % IV BOLUS
1000.0000 mL | Freq: Once | INTRAVENOUS | Status: AC
Start: 2017-09-17 — End: 2017-09-17
  Administered 2017-09-17: 1000 mL via INTRAVENOUS

## 2017-09-17 MED ORDER — ONDANSETRON HCL 4 MG/2ML IJ SOLN
4.0000 mg | Freq: Once | INTRAMUSCULAR | Status: AC
  Administered 2017-09-17: 4 mg via INTRAVENOUS
  Filled 2017-09-17: qty 2

## 2017-09-17 NOTE — ED Triage Notes (Addendum)
Had a nosebleed on Friday and it poured blood.  I have been coughing a lot and my chest hurts.  My throat has been sore.  I have been drinking water all day and have not ate anything.  Vomited phlegm per pt.  I have tried gargling with salt water and it has not helped.  I have been taking cold and flu meds without relief.  Also took my mother's old amoxil.  Goody powders for aches and pains.  I have also been taking mucinex.  Patient states that she has been using her inhaler every 2 hours to help with her breathing.

## 2017-09-17 NOTE — ED Provider Notes (Signed)
[facility] EMERGENCY DEPARTMENT Provider Note   CSN: [id number] Arrival date & time: 09/16/17  2334     History   Chief Complaint Chief complaint- cough and vomiting   Pt seen with NP student, I performed history/physical/documentation HPI Destiny Leonard is a 27 y.o. adult.  The history is provided by the patient.  Cough  This is a new problem. The current episode started more than 2 days ago. The problem occurs hourly. The problem has been gradually worsening. There has been no fever. Treatments tried: OTC meds. The treatment provided no relief.  She presents for multiple complaints.  Patient reports multiple episodes of epistaxis recently, none today. She also reports over the past several days she has had increasing cough.  She also reports recent nausea and vomiting.  She reports he has diffuse body pain when she coughs.  No focal abdominal pain at this time.  Chest pain only with cough. She is also tried using albuterol without improvement Past Medical History:  Diagnosis Date  . Asthma     There are no active problems to display for this patient.   Past Surgical History:  Procedure Laterality Date  . CHOLECYSTECTOMY    . SKIN GRAFT       OB History   None      Home Medications    Prior to Admission medications   Medication Sig Start Date End Date Taking? Authorizing Provider  acetaminophen (TYLENOL) 500 MG tablet Take 500 mg by mouth every 6 (six) hours as needed for mild pain or moderate pain.     [provider]  azithromycin (ZITHROMAX) 250 MG tablet Take 1 tablet (250 mg total) daily by mouth. Take first 2 tablets together, then 1 every day until finished. 02/17/17   Janne NapoleonNeese, Hope M, NP  benzonatate (TESSALON) 100 MG capsule Take 1 capsule (100 mg total) every 8 (eight) hours by mouth. 02/17/17   Janne NapoleonNeese, Hope M, NP  citalopram (CELEXA) 20 MG tablet Take 20 mg by mouth daily.    [provider]  guaiFENesin (MUCINEX) 600 MG 12 hr tablet  Take 600 mg by mouth 2 (two) times daily as needed for cough or to loosen phlegm.    [provider]  ondansetron (ZOFRAN ODT) 4 MG disintegrating tablet Take 1 tablet (4 mg total) by mouth every 8 (eight) hours as needed for nausea. 06/21/15   Eber HongMiller, Brian, MD  predniSONE (DELTASONE) 10 MG tablet Take 2 tablets (20 mg total) 2 (two) times daily with a meal by mouth. 02/17/17   Neese, Theodora BlowHope M, NP  Pseudoeph-Doxylamine-DM-APAP (DAYQUIL/NYQUIL COLD/FLU RELIEF PO) Take 30 mLs by mouth daily as needed (cold).    [provider]    Family History History reviewed. No pertinent family history.  Social History Social History   Tobacco Use  . Smoking status: Former Games developermoker  . Smokeless tobacco: Never Used  Substance Use Topics  . Alcohol use: No  . Drug use: No     Allergies   Contrast media [iodinated diagnostic agents] and Iodine   Review of Systems Review of Systems  Constitutional: Positive for fatigue.  Respiratory: Positive for cough.   Gastrointestinal: Positive for vomiting.  All other systems reviewed and are negative.    Physical Exam Updated Vital Signs BP 101/61 (BP Location: Left Arm)   Pulse 72   Temp 98.2 F (36.8 C) (Oral)   Resp 16   Ht 1.626 m (5\' 4" )   Wt 116.2 kg (256 lb 4 oz)  SpO2 97%   BMI 43.99 kg/m   Physical Exam  CONSTITUTIONAL: Well developed/well nourished HEAD: Normocephalic/atraumatic EYES: EOMI ENMT: Mucous membranes moist, no evidence of epistaxis NECK: supple no meningeal signs SPINE/BACK:entire spine nontender CV: S1/S2 noted, no murmurs/rubs/gallops noted LUNGS: Lungs are clear to auscultation bilaterally, no apparent distress ABDOMEN: soft, nontender, no rebound or guarding, bowel sounds noted throughout abdomen NEURO: Pt is awake/alert/appropriate, moves all extremitiesx4.  No facial droop.   EXTREMITIES: pulses normal/equal, full ROM SKIN: warm, color normal PSYCH: no abnormalities of mood noted, alert and  oriented to situation  ED Treatments / Results  Labs (all labs ordered are listed, but only abnormal results are displayed) Labs Reviewed  I-STAT CHEM 8, ED - Abnormal; Notable for the following components:      Result Value   Glucose, Bld 115 (*)    Hemoglobin 11.6 (*)    HCT 34.0 (*)    All other components within normal limits    EKG None  Radiology No results found.  Procedures Procedures (including critical care time)  Medications Ordered in ED Medications  sodium chloride 0.9 % bolus 1,000 mL (0 mLs Intravenous Stopped 09/17/17 0225)  ondansetron (ZOFRAN) injection 4 mg (4 mg Intravenous Given 09/17/17 0150)  sodium chloride 0.9 % bolus 1,000 mL (0 mLs Intravenous Stopped 09/17/17 0322)     Initial Impression / Assessment and Plan / ED Course  I have reviewed the triage vital signs and the nursing notes.  Pertinent labs  results that were available during my care of the patient were reviewed by me and considered in my medical decision making (see chart for details).     Patient in emergency department for cough and vomiting.  She reports feeling dehydrated.  She was given IV fluids with some improvement.  Lung sounds clear, I doubt she has pneumonia.  Vital signs are appropriate this time.  She is requesting discharge home.  Final Clinical Impressions(s) / ED Diagnoses   Final diagnoses:  Cough  Non-intractable cyclical vomiting with nausea    ED Discharge Orders    None       Zadie Rhine, MD 09/17/17 [phone]

## 2017-09-17 NOTE — ED Notes (Signed)
Patient states that she does not feel so well since she has been sick so long.

## 2017-10-16 ENCOUNTER — Encounter (HOSPITAL_COMMUNITY): Payer: Self-pay | Admitting: Emergency Medicine

## 2017-10-16 ENCOUNTER — Emergency Department (HOSPITAL_COMMUNITY): Payer: BLUE CROSS/BLUE SHIELD

## 2017-10-16 ENCOUNTER — Other Ambulatory Visit: Payer: Self-pay

## 2017-10-16 ENCOUNTER — Emergency Department (HOSPITAL_COMMUNITY)
Admission: EM | Admit: 2017-10-16 | Discharge: 2017-10-17 | Disposition: A | Discharge status: Home / Self Care | Payer: BLUE CROSS/BLUE SHIELD | Attending: Emergency Medicine | Admitting: Emergency Medicine

## 2017-10-16 DIAGNOSIS — B9689 Other specified bacterial agents as the cause of diseases classified elsewhere: Secondary | ICD-10-CM | POA: Insufficient documentation

## 2017-10-16 DIAGNOSIS — R1033 Periumbilical pain: Secondary | ICD-10-CM | POA: Diagnosis not present

## 2017-10-16 DIAGNOSIS — R110 Nausea: Secondary | ICD-10-CM | POA: Insufficient documentation

## 2017-10-16 DIAGNOSIS — R809 Proteinuria, unspecified: Secondary | ICD-10-CM | POA: Diagnosis not present

## 2017-10-16 DIAGNOSIS — D509 Iron deficiency anemia, unspecified: Secondary | ICD-10-CM | POA: Diagnosis not present

## 2017-10-16 DIAGNOSIS — N760 Acute vaginitis: Secondary | ICD-10-CM | POA: Insufficient documentation

## 2017-10-16 DIAGNOSIS — Z87891 Personal history of nicotine dependence: Secondary | ICD-10-CM | POA: Diagnosis not present

## 2017-10-16 DIAGNOSIS — R102 Pelvic and perineal pain: Secondary | ICD-10-CM

## 2017-10-16 LAB — CBC WITH DIFFERENTIAL/PLATELET
Basophils Absolute: 0 10*3/uL (ref 0.0–0.1)
Basophils Relative: 0 %
EOS PCT: 1 %
Eosinophils Absolute: 0.1 10*3/uL (ref 0.0–0.7)
HEMATOCRIT: 34.5 % — AB (ref 36.0–46.0)
HEMOGLOBIN: 11 g/dL — AB (ref 12.0–15.0)
LYMPHS ABS: 1.9 10*3/uL (ref 0.7–4.0)
LYMPHS PCT: 16 %
MCH: 26.2 pg (ref 26.0–34.0)
MCHC: 31.9 g/dL (ref 30.0–36.0)
MCV: 82.1 fL (ref 78.0–100.0)
Monocytes Absolute: 0.4 10*3/uL (ref 0.1–1.0)
Monocytes Relative: 4 %
NEUTROS ABS: 9.3 10*3/uL — AB (ref 1.7–7.7)
NEUTROS PCT: 79 %
Platelets: 354 10*3/uL (ref 150–400)
RBC: 4.2 MIL/uL (ref 3.87–5.11)
RDW: 15.7 % — ABNORMAL HIGH (ref 11.5–15.5)
WBC: 11.8 10*3/uL — ABNORMAL HIGH (ref 4.0–10.5)

## 2017-10-16 LAB — COMPREHENSIVE METABOLIC PANEL
ALT: 27 U/L (ref 0–44)
AST: 22 U/L (ref 15–41)
Albumin: 4 g/dL (ref 3.5–5.0)
Alkaline Phosphatase: 81 U/L (ref 38–126)
Anion gap: 9 (ref 5–15)
BILIRUBIN TOTAL: 0.2 mg/dL — AB (ref 0.3–1.2)
BUN: 14 mg/dL (ref 6–20)
CO2: 25 mmol/L (ref 22–32)
CREATININE: 0.81 mg/dL (ref 0.44–1.00)
Calcium: 9.3 mg/dL (ref 8.9–10.3)
Chloride: 107 mmol/L (ref 98–111)
Glucose, Bld: 102 mg/dL — ABNORMAL HIGH (ref 70–99)
Potassium: 3.8 mmol/L (ref 3.5–5.1)
Sodium: 141 mmol/L (ref 135–145)
Total Protein: 7.3 g/dL (ref 6.5–8.1)

## 2017-10-16 LAB — URINALYSIS, ROUTINE W REFLEX MICROSCOPIC
Bacteria, UA: NONE SEEN
Bilirubin Urine: NEGATIVE
GLUCOSE, UA: NEGATIVE mg/dL
Hgb urine dipstick: NEGATIVE
KETONES UR: NEGATIVE mg/dL
Leukocytes, UA: NEGATIVE
Nitrite: NEGATIVE
PH: 6 (ref 5.0–8.0)
Protein, ur: 30 mg/dL — AB
Specific Gravity, Urine: 1.028 (ref 1.005–1.030)

## 2017-10-16 LAB — I-STAT BETA HCG BLOOD, ED (MC, WL, AP ONLY): I-stat hCG, quantitative: <5 m[IU]/mL (ref <5)

## 2017-10-16 LAB — WET PREP, GENITAL
Sperm: NONE SEEN
TRICH WET PREP: NONE SEEN
YEAST WET PREP: NONE SEEN

## 2017-10-16 MED ORDER — ONDANSETRON HCL 4 MG PO TABS: 4.0000 mg | ORAL_TABLET | Freq: Four times a day (QID) | ORAL | 0 refills | Status: AC

## 2017-10-16 MED ORDER — IBUPROFEN 600 MG PO TABS: 600.0000 mg | ORAL_TABLET | Freq: Four times a day (QID) | ORAL | 0 refills | Status: AC | PRN

## 2017-10-16 MED ORDER — OXYCODONE-ACETAMINOPHEN 5-325 MG PO TABS
2.0000 | ORAL_TABLET | Freq: Once | ORAL | Status: AC
  Administered 2017-10-16: 2 via ORAL
  Filled 2017-10-16: qty 2

## 2017-10-16 MED ORDER — MORPHINE SULFATE (PF) 4 MG/ML IV SOLN
4.0000 mg | Freq: Once | INTRAVENOUS | Status: AC
  Administered 2017-10-16: 4 mg via INTRAVENOUS
  Filled 2017-10-16: qty 1

## 2017-10-16 MED ORDER — ONDANSETRON HCL 4 MG/2ML IJ SOLN
4.0000 mg | Freq: Once | INTRAMUSCULAR | Status: AC
  Administered 2017-10-16: 4 mg via INTRAVENOUS
  Filled 2017-10-16: qty 2

## 2017-10-16 MED ORDER — HYDROCODONE-ACETAMINOPHEN 5-325 MG PO TABS: 1.0000 | ORAL_TABLET | Freq: Four times a day (QID) | ORAL | 0 refills | Status: AC | PRN

## 2017-10-16 MED ORDER — LIDOCAINE HCL (PF) 1 % IJ SOLN
INTRAMUSCULAR | Status: AC
  Filled 2017-10-16: qty 30

## 2017-10-16 MED ORDER — BELLADONNA ALKALOIDS-OPIUM 16.2-60 MG RE SUPP
1.0000 | Freq: Once | RECTAL | Status: AC
  Administered 2017-10-16: 1 via RECTAL
  Filled 2017-10-16: qty 1

## 2017-10-16 MED ORDER — METRONIDAZOLE 500 MG PO TABS: 500.0000 mg | ORAL_TABLET | Freq: Two times a day (BID) | ORAL | 0 refills | Status: AC

## 2017-10-16 MED ORDER — DIAZEPAM 5 MG PO TABS
5.0000 mg | ORAL_TABLET | Freq: Once | ORAL | Status: AC
  Administered 2017-10-16: 5 mg via ORAL
  Filled 2017-10-16: qty 1

## 2017-10-16 MED ORDER — KETOROLAC TROMETHAMINE 15 MG/ML IJ SOLN
15.0000 mg | Freq: Once | INTRAMUSCULAR | Status: AC
  Administered 2017-10-16: 15 mg via INTRAVENOUS
  Filled 2017-10-16: qty 1

## 2017-10-16 MED ORDER — SODIUM CHLORIDE 0.9 % IV BOLUS
1000.0000 mL | Freq: Once | INTRAVENOUS | Status: AC
  Administered 2017-10-16: 1000 mL via INTRAVENOUS

## 2017-10-16 MED ORDER — NAPROXEN 375 MG PO TABS: 375.0000 mg | ORAL_TABLET | Freq: Two times a day (BID) | ORAL | 0 refills | Status: AC

## 2017-10-16 NOTE — Discharge Instructions (Addendum)
Please see the information and instructions below regarding your visit.  Your diagnoses today include:  1. Suprapubic pain   2. Proteinuria, unspecified type   3. Microcytic anemia   4. Bacterial vaginosis     Tests performed today include: See side panel of your discharge paperwork for testing performed today. Vital signs are listed at the bottom of these instructions.   Your CT scan is demonstrating a approximately 3 mm cystic lesion on the right ovary.  They recommend a ultrasound repeated in 6 to 12 weeks.  This can be performed by OB/GYN.  This is likely due to resuming menses.  Medications prescribed:    Take any prescribed medications only as prescribed, and any over the counter medications only as directed on the packaging.  Flagyl is an antibiotic used to treat bacterial vaginosis. This medication CANNOT be taken with alcohol, because it can cause nausea and vomiting combined with alcohol. Please also refrain from drinking alcohol for 48 hours after you finish the medicine.  You are prescribed ibuprofen, a non-steroidal anti-inflammatory agent (NSAID) for pain. You may take 600 mg every 6  hours as needed for pain. If still requiring this medication around the clock for acute pain after 10 days, please see your primary healthcare provider.  Women who are pregnant, breastfeeding, or planning on becoming pregnant should not take non-steroidal anti-inflammatories such as Advil and Aleve. Tylenol is a safe over the counter pain reliever in pregnant women..  This is not a long-term medication unless under the care and direction of your primary provider. Taking this medication long-term and not under the supervision of a healthcare provider could increase the risk of stomach ulcers, kidney problems, and cardiovascular problems such as high blood pressure.   You have been prescribed Norco for pain. This is an opioid pain medication. You may take this medication every 4-6 hours as needed  for pain. Only take this medication if you need it for breakthrough pain. You may combine this medicine with ibuprofen, a non-steroidal anti-inflammatory drug (NSAID) every 6 hours, so you are getting something for pain relief every 3 hours.  Do not combine this medication with Tylenol, as it may increase the risk of liver problems.  Do not combine this medication with alcohol.  Please be advised to avoid driving or operating heavy machinery while taking this medication, as it may make you drowsy or impair judgment.    Home care instructions:  Please follow any educational materials contained in this packet.   Please stay hydrated at all times.  This will keep your bladder flow and promote the urge to urinate.  Follow-up instructions: Please follow-up with your primary care provider as soon as possible for further evaluation of your symptoms if they are not completely improved.   Please follow up with OBGYN as soon as possible. I will write them a note to let them know you need follow up.  Return instructions:  Please return to the Emergency Department if you experience worsening symptoms.  Please return the emergency department for any worsening abdominal pain, abdominal pain focused in the right lower side or left lower side, fevers with abdominal pain, or persistent inability to urinate. Please return if you have any other emergent concerns.  Additional Information:   Your vital signs today were: BP 115/81 (BP Location: Right Arm)    Pulse 92    Temp 97.6 F (36.4 C) (Oral)    Resp 20    LMP 08/16/2017    SpO2  99%  If your blood pressure (BP) was elevated on multiple readings during this visit above 130 for the top number or above 80 for the bottom number, please have this repeated by your primary care provider within one month. --------------  Thank you for allowing us to participate in your care today.

## 2017-10-16 NOTE — ED Triage Notes (Signed)
Pt from home with urinary retention x 1 week with "a few" instances of urination. Pt went to UC for a catheter. Pt states they were unable to place foley at that time. Pt is tearful at time of assessment

## 2017-10-16 NOTE — ED Provider Notes (Signed)
[facility]-EMERGENCY DEPT Provider Note   CSN: [id number] Arrival date & time: 10/16/17  1931     History   Chief Complaint No chief complaint on file.   HPI Destiny Greenlexandra Hauge is a 27 y.o. adult.  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  Patient reports that he began having abdominal pain suddenly this afternoon, and realized that he had not urinated in 2 to 3 days.  Patient reports that sensation of attempting to urinate will cause severe and sharp suprapubic pain.  Patient denies any hematuria, vaginal discharge, vaginal bleeding.  Menses are regular, as patient recently came off of testosterone 2 months ago due to being unable to afford it.  Patient denies any fevers, chills.  Patient reports nausea without vomiting.  Last bowel movement yesterday and normal without melena or hematochezia.  Abdominal surgical history significant for cholecystectomy.  Patient denies performing or receiving any penetrative sexual activity, and denies any sexual activity, reporting asexuality.   Past Medical History:  Diagnosis Date  . Asthma     There are no active problems to display for this patient.   Past Surgical History:  Procedure Laterality Date  . CHOLECYSTECTOMY    . SKIN GRAFT       OB History   None      Home Medications    Prior to Admission medications   Medication Sig Start Date End Date Taking? Authorizing Provider  Aspirin-Salicylamide-Caffeine (BC HEADACHE POWDER PO) Take 1 packet by mouth daily as needed (pain).   Yes [provider]  azithromycin (ZITHROMAX) 250 MG tablet Take 1 tablet (250 mg total) daily by mouth. Take first 2 tablets together, then 1 every day until finished. Patient not taking: Reported on 10/16/2017 02/17/17   Janne NapoleonNeese, Hope M, NP  benzonatate (TESSALON) 100 MG capsule Take 1 capsule (100 mg  total) every 8 (eight) hours by mouth. Patient not taking: Reported on 10/16/2017 02/17/17   Janne NapoleonNeese, Hope M, NP  ondansetron (ZOFRAN ODT) 4 MG disintegrating tablet Take 1 tablet (4 mg total) by mouth every 8 (eight) hours as needed for nausea. Patient not taking: Reported on 10/16/2017 06/21/15   Eber HongMiller, Brian, MD  predniSONE (DELTASONE) 10 MG tablet Take 2 tablets (20 mg total) 2 (two) times daily with a meal by mouth. Patient not taking: Reported on 10/16/2017 02/17/17   Janne NapoleonNeese, Hope M, NP    Family History No family history on file.  Social History Social History   Tobacco Use  . Smoking status: Former Games developermoker  . Smokeless tobacco: Never Used  Substance Use Topics  . Alcohol use: No  . Drug use: No     Allergies   Contrast media [iodinated diagnostic agents] and Iodine   Review of Systems Review of Systems  Constitutional: Negative for chills and fever.  Respiratory: Negative for cough and shortness of breath.   Cardiovascular: Negative for chest pain.  Gastrointestinal: Positive for abdominal pain and nausea. Negative for blood in stool and vomiting.  Genitourinary: Negative for dysuria, flank pain and urgency.       +suprapubic pain  Allergic/Immunologic: Negative for immunocompromised state.  All other systems reviewed and are negative.    Physical Exam Updated Vital Signs BP 115/81 (BP Location: Right Arm)   Pulse 92   Temp 97.6 F (36.4 C) (Oral)   Resp 20   LMP 08/16/2017   SpO2 99%  Physical Exam  Constitutional: He appears well-developed and well-nourished. No distress.  HENT:  Head: Normocephalic and atraumatic.  Mouth/Throat: Oropharynx is clear and moist.  Eyes: Pupils are equal, round, and reactive to light. Conjunctivae and EOM are normal.  Neck: Normal range of motion. Neck supple.  Cardiovascular: Normal rate, regular rhythm, S1 normal and S2 normal.  No murmur heard. Pulmonary/Chest: Effort normal and breath sounds normal. He has no wheezes. He  has no rales.  Abdominal: Soft. He exhibits no distension. There is tenderness. There is no guarding.  Suprapubic pain without guarding or rebound.  Genitourinary:  Genitourinary Comments: Genitalia examination performed with EMT chaperone present.  External genitalia without lesions.  There is edema surrounding the urethra.  Appears to be no lesions or erythema of the introitus.  Musculoskeletal: Normal range of motion. He exhibits no edema or deformity.  Lymphadenopathy:    He has no cervical adenopathy.  Neurological: He is alert.  Cranial nerves grossly intact. Patient moves extremities symmetrically and with good coordination.  Skin: Skin is warm and dry. No rash noted. No erythema.  Psychiatric: He has a normal mood and affect. His behavior is normal. Judgment and thought content normal.  Nursing note and vitals reviewed.    ED Treatments / Results  Labs (all labs ordered are listed, but only abnormal results are displayed) Labs Reviewed  URINALYSIS, ROUTINE W REFLEX MICROSCOPIC - Abnormal; Notable for the following components:      Result Value   Protein, ur 30 (*)    All other components within normal limits  CBC WITH DIFFERENTIAL/PLATELET - Abnormal; Notable for the following components:   WBC 11.8 (*)    Hemoglobin 11.0 (*)    HCT 34.5 (*)    RDW 15.7 (*)    Neutro Abs 9.3 (*)    All other components within normal limits  COMPREHENSIVE METABOLIC PANEL - Abnormal; Notable for the following components:   Glucose, Bld 102 (*)    Total Bilirubin 0.2 (*)    All other components within normal limits  URINE CULTURE  WET PREP, GENITAL  I-STAT BETA HCG BLOOD, ED ([facility], [facility], [facility] ONLY)    EKG None  Radiology Ct Renal Stone Study  Result Date: 10/16/2017 CLINICAL DATA:  Initial evaluation for urinary retention for 1 week. EXAM: CT ABDOMEN AND PELVIS WITHOUT CONTRAST TECHNIQUE: Multidetector CT imaging of the abdomen and pelvis was performed following the standard protocol  without IV contrast. COMPARISON:  Prior CT from 07/25/2014. FINDINGS: Lower chest: Visualized lung bases are clear. Hepatobiliary: Mild diffuse hypoattenuation liver suggestive of steatosis. Limited noncontrast evaluation of the liver otherwise unremarkable. Gallbladder appears to be absent. No biliary dilatation. Pancreas: Pancreas within normal limits. Spleen: Unremarkable. Adrenals/Urinary Tract: Adrenal glands are normal. Kidneys equal in size without evidence for nephrolithiasis or hydronephrosis. No radiopaque calculi seen along the course of either renal collecting system. No hydroureter. Partially distended bladder within normal limits. No layering stones within the bladder lumen. Stomach/Bowel: Stomach within normal limits. No evidence for bowel obstruction. Normal appendix. No acute inflammatory changes seen about the bowels. Vascular/Lymphatic: Intra-abdominal aorta of normal caliber. No adenopathy. Reproductive: Uterus within normal limits. Left ovary unremarkable. Approximate 2.9 cm right adnexal cystic lesion, favored to reflect an exophytic versus paraovarian cyst this is minimally complex intensity. Other: No free air. Trace free fluid within the pelvis, likely physiologic. Small fat containing paraumbilical hernia noted. Musculoskeletal: No acute osseous abnormality. No worrisome lytic or blastic osseous lesions. Mild sclerotic change about the bilateral  SI joints, most consistent with osteitis condensans ilia. IMPRESSION: 1. No CT evidence for nephrolithiasis or obstructive uropathy. 2. 2.9 cm right adnexal cystic lesion, favored to reflect a mildly complex and/or hemorrhagic cyst. A follow-up pelvic ultrasound in 6-12 weeks could be performed for further evaluation as clinically warranted. 3. Hepatic steatosis. Electronically Signed   By: Rise Mu M.D.   On: 10/16/2017 22:08    Procedures Procedures (including critical care time)  Medications Ordered in ED Medications    lidocaine (PF) (XYLOCAINE) 1 % injection (has no administration in time range)  morphine 4 MG/ML injection 4 mg (4 mg Intravenous Given 10/16/17 2104)  sodium chloride 0.9 % bolus 1,000 mL (1,000 mLs Intravenous New Bag/Given 10/16/17 2104)  ondansetron (ZOFRAN) injection 4 mg (4 mg Intravenous Given 10/16/17 2107)     Initial Impression / Assessment and Plan / ED Course  I have reviewed the triage vital signs and the nursing notes.  Pertinent labs & imaging results that were available during my care of the patient were reviewed by me and considered in my medical decision making (see chart for details).     Patient is nontoxic-appearing, afebrile, and has benign abdomen.  Differential diagnosis includes urinary tract infection, appendicitis, ovarian torsion, PID, interstitial cystitis, bladder spasming, mass effect and GU obstruction.   Doubt PID, as patient denies any penetrative sexual activity which [place] be introduced.  Doubt ovarian torsion, as patient is not having focal right lower quadrant tenderness, and pain is in the suprapubic region.  Given the patient only had 225 mL of urine present in bladder on catheterization, feel that patient is likely passing small amounts of urine throughout the day.  Hemoglobin one-point lower than previously.  Normal kidney function.  Suspect leukocytosis is secondary to stress demargination.  No evidence of urinary tract infection.  CT renal stone study demonstrating 2.9 right adnexal cystic lesion favored mildly complex or hemorrhagic cyst.  Suspect the patient is expensing bladder spasming secondary to this cyst possibly compressing the ureter.  Recommend follow-up pelvic ultrasound in 6 to 12 weeks.  Patient informed of this result.  Wet prep demonstrating bacterial vaginosis, and will treat with Flagyl.  Hemoglobin  Date Value Ref Range Status  10/16/2017 11.0 (L) 12.0 - 15.0 g/dL Final  16/01/9603 54.0 (L) 12.0 - 15.0 g/dL Final  98/02/9146  82.9 12.0 - 15.0 g/dL Final  56/21/3086 57.8 12.0 - 15.0 g/dL Final   Patient with improved pain in multiple reassessments.  Patient received belladonna suppository.  Patient had improved urination with pelvic relaxation.  Patient given OB/GYN follow-up for repeat ultrasound.  Patient is instructed return to emergency department for any acutely worsening abdominal pain, or persistent inability to urinate.   I have reviewed the patient's information in the [place] Controlled Substance Database for the past 12 months and found them to have no overlapping Rx.  Opiates were prescribed for an acute, painful condition. The patient was given information on side effects and encouraged to use other, non-opiate pain medication primary, only using opiate medicine sparingly for severe pain.  This is a supervised visit with Dr. Shaune Pollack. Evaluation, management, and discharge planning discussed with this attending physician.  Final Clinical Impressions(s) / ED Diagnoses   Final diagnoses:  Suprapubic pain  Proteinuria, unspecified type  Microcytic anemia  Bacterial vaginosis      Destiny Leonard 10/17/17 Haskell Riling, MD 10/17/17 1108

## 2017-10-16 NOTE — ED Notes (Signed)
EDP at bedside  

## 2017-10-18 LAB — URINE CULTURE

## 2017-11-19 ENCOUNTER — Encounter: Payer: BLUE CROSS/BLUE SHIELD | Admitting: Obstetrics & Gynecology

## 2017-11-25 ENCOUNTER — Encounter: Payer: BLUE CROSS/BLUE SHIELD | Admitting: Obstetrics and Gynecology

## 2020-01-13 IMAGING — CT CT RENAL STONE PROTOCOL
2 of 4 series · 16 of 46 positions shown, 18 images · non-contrast
Comparison: Prior CT from 07/25/2014.

CLINICAL DATA: Initial evaluation for urinary retention for 1 week.

EXAM:
CT ABDOMEN AND PELVIS WITHOUT CONTRAST
TECHNIQUE: Multidetector CT imaging of the abdomen and pelvis was performed
following the standard protocol without IV contrast.

[Series 2: axial st · axial · 0.80mm/px · z∈[-471,-66]mm · 13 of 91 slices shown, 15 images]
[im 5/91  soft-tissue]
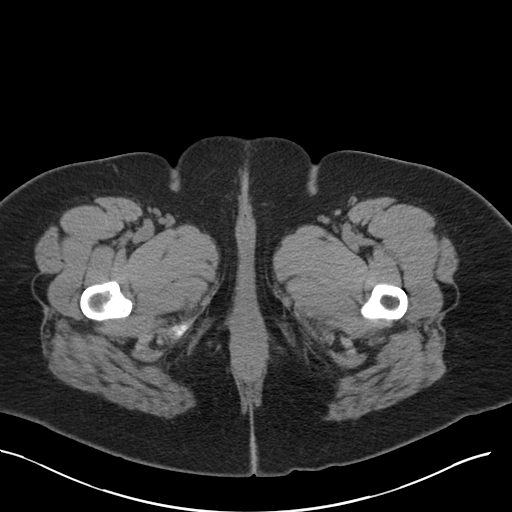
[im 5/91  bone]
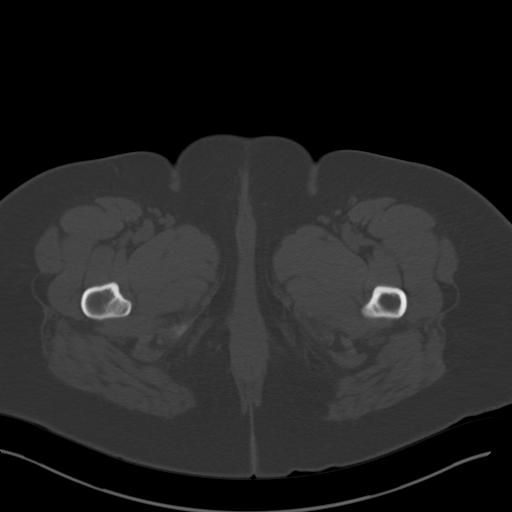
[im 15/91  soft-tissue]
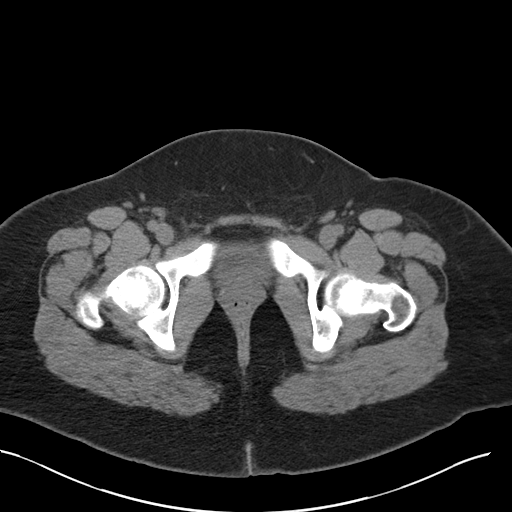
[im 19/91  soft-tissue]
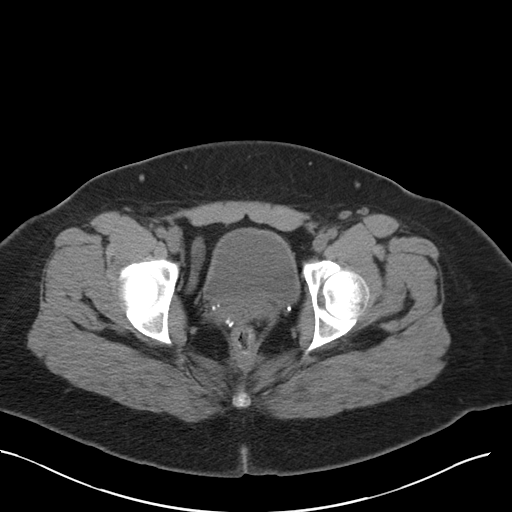
[im 24/91  soft-tissue]
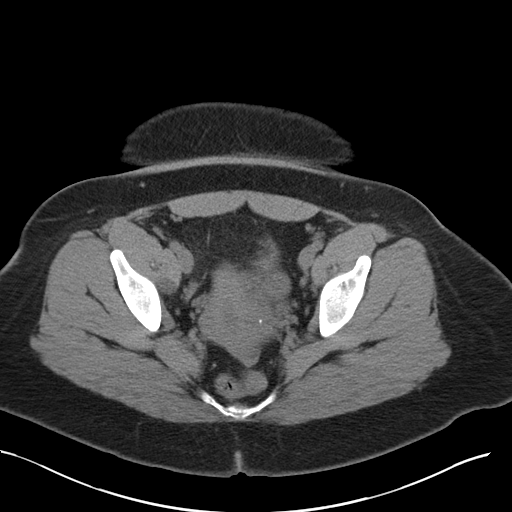
[im 34/91  soft-tissue]
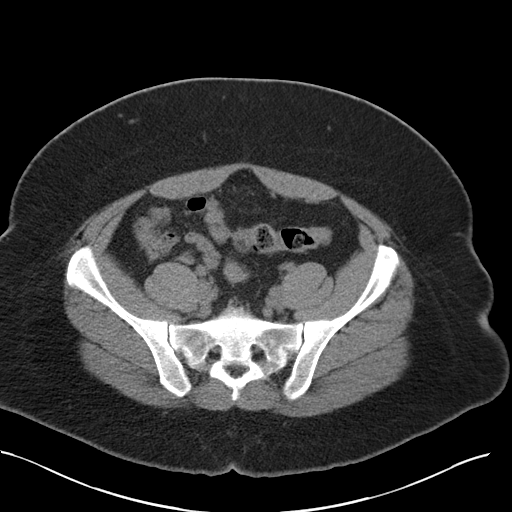
[im 38/91  soft-tissue]
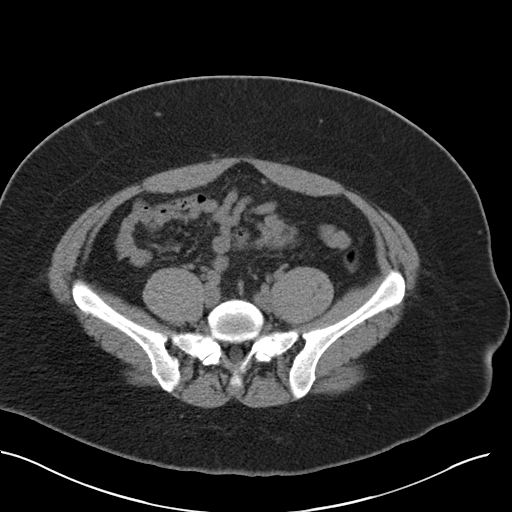
[im 48/91  soft-tissue]
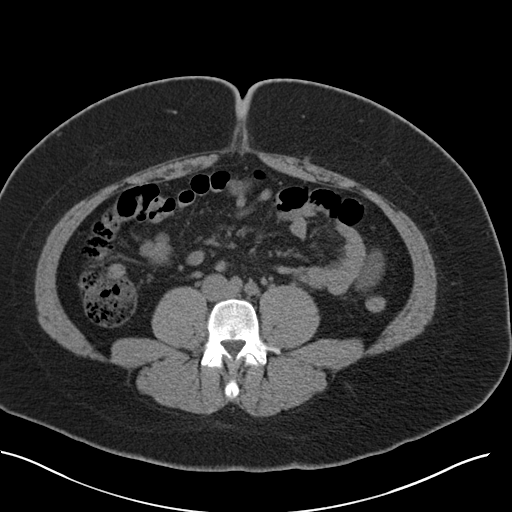
[im 53/91  soft-tissue]
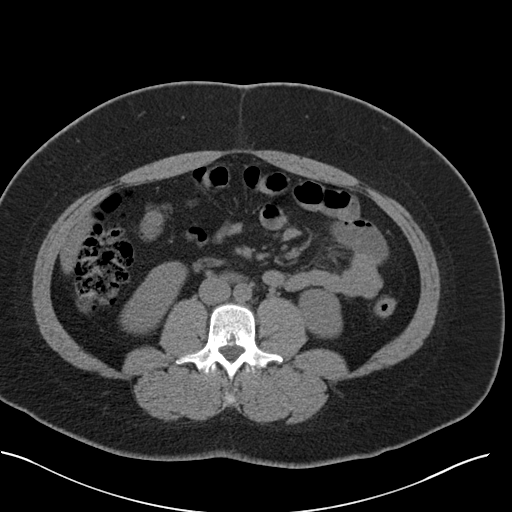
[im 57/91  soft-tissue]
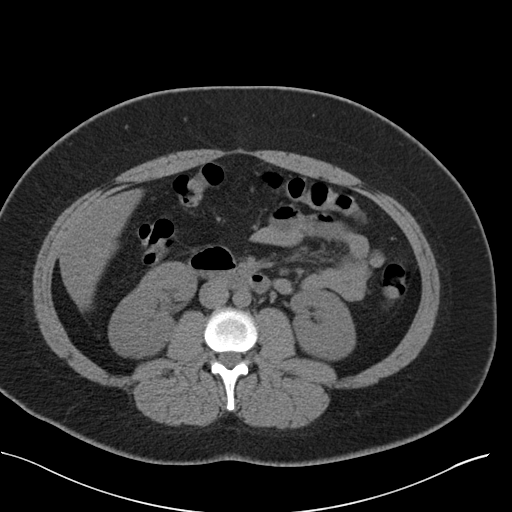
[im 57/91  bone]
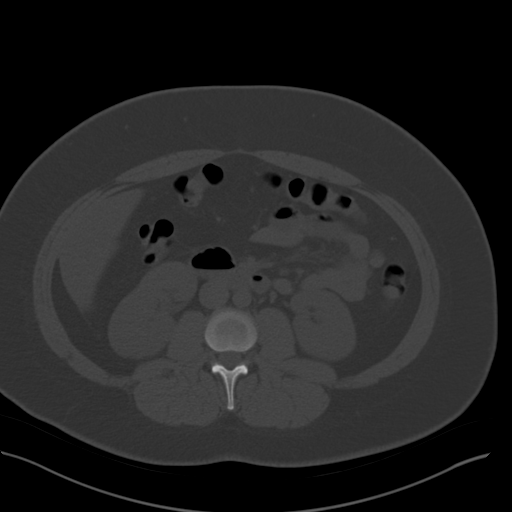
[im 67/91  soft-tissue]
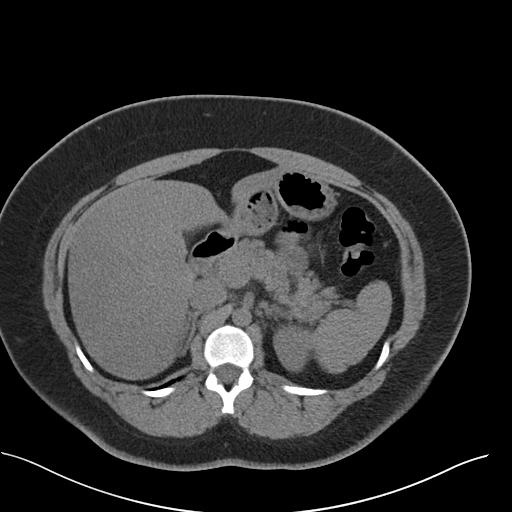
[im 72/91  soft-tissue]
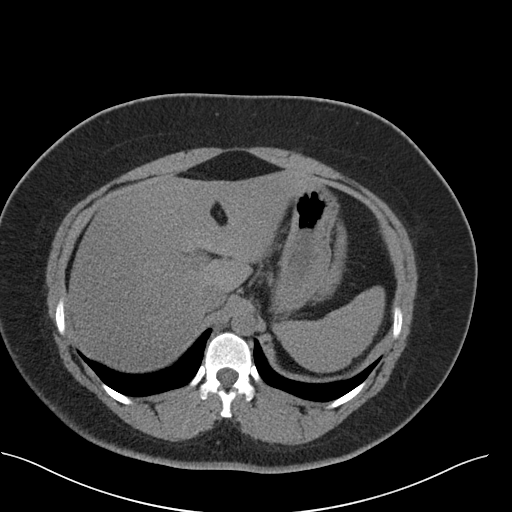
[im 76/91  soft-tissue]
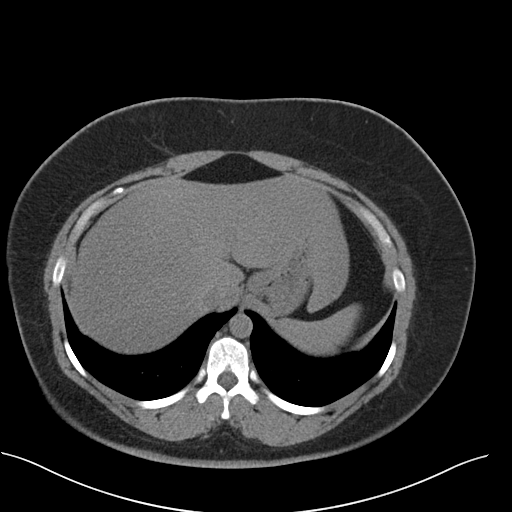
[im 86/91  soft-tissue]
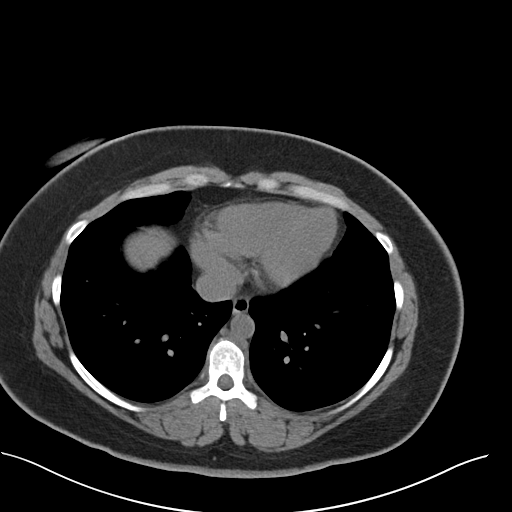

[Series 4: coronal · coronal · 0.81mm/px · 3 of 155 slices shown]
[im 52/155  soft-tissue]
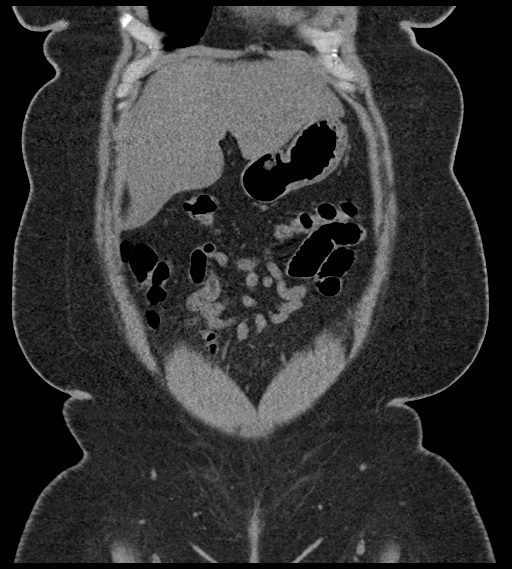
[im 69/155  soft-tissue]
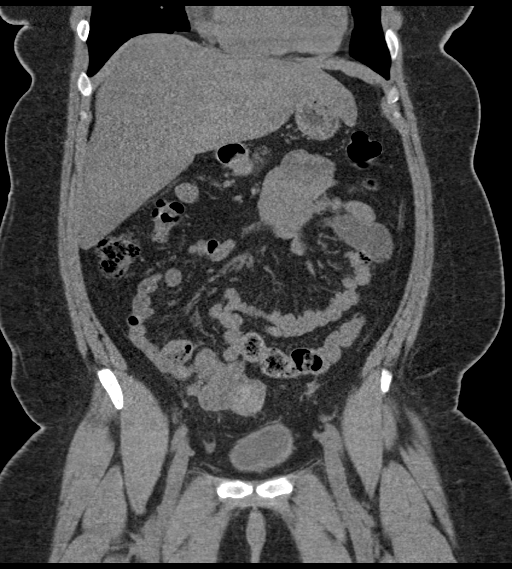
[im 86/155  soft-tissue]
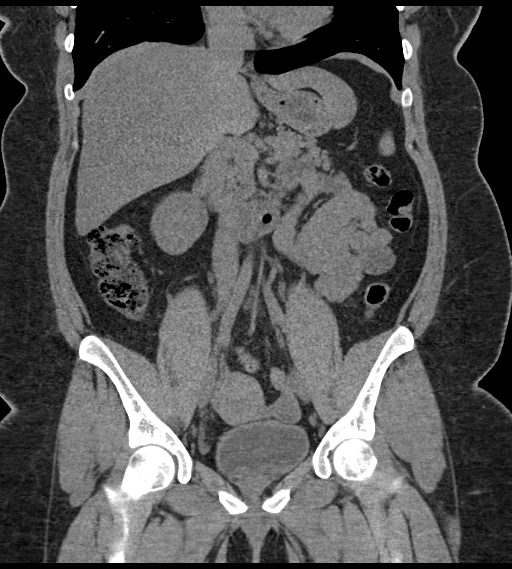

[16 of 46 positions shown; findings below may reference images not displayed]

FINDINGS: Lower chest: Visualized lung bases are clear.

Hepatobiliary: Mild diffuse hypoattenuation liver suggestive of
steatosis. Limited noncontrast evaluation of the liver otherwise
unremarkable. Gallbladder appears to be absent. No biliary
dilatation.

Pancreas: Pancreas within normal limits.

Spleen: Unremarkable.

Adrenals/Urinary Tract: Adrenal glands are normal. Kidneys equal in
size without evidence for nephrolithiasis or hydronephrosis. No
radiopaque calculi seen along the course of either renal collecting
system. No hydroureter. Partially distended bladder within normal
limits. No layering stones within the bladder lumen.

Stomach/Bowel: Stomach within normal limits. No evidence for bowel
obstruction. Normal appendix. No acute inflammatory changes seen
about the bowels.

Vascular/Lymphatic: Intra-abdominal aorta of normal caliber. No
adenopathy.

Reproductive: Uterus within normal limits. Left ovary unremarkable.
Approximate 2.9 cm right adnexal cystic lesion, favored to reflect
an exophytic versus paraovarian cyst this is minimally complex
intensity.

Other: No free air. Trace free fluid within the pelvis, likely
physiologic. Small fat containing paraumbilical hernia noted.

Musculoskeletal: No acute osseous abnormality. No worrisome lytic or
blastic osseous lesions. Mild sclerotic change about the bilateral
SI joints, most consistent with osteitis condensans [place].
IMPRESSION: 1. No CT evidence for nephrolithiasis or obstructive uropathy.
2. 2.9 cm right adnexal cystic lesion, favored to reflect a mildly
complex and/or hemorrhagic cyst. A follow-up pelvic ultrasound in
6-12 weeks could be performed for further evaluation as clinically
warranted.
3. Hepatic steatosis.
